# Patient Record
Sex: Male | Born: 1985 | Hispanic: Yes | Marital: Single | Smoking: Current every day smoker
Health system: Southern US, Community
[De-identification: ages and names within clinical notes are randomized; demographics above are authoritative.]

## PROBLEM LIST (undated history)

## (undated) DIAGNOSIS — N200 Calculus of kidney: Secondary | ICD-10-CM

## (undated) HISTORY — PX: CHOLECYSTECTOMY: SHX55

---

## 2012-04-01 ENCOUNTER — Emergency Department (HOSPITAL_BASED_OUTPATIENT_CLINIC_OR_DEPARTMENT_OTHER)
Admission: EM | Admit: 2012-04-01 | Discharge: 2012-04-01 | Disposition: A | Discharge status: Home / Self Care | Payer: Self-pay | Attending: Emergency Medicine | Admitting: Emergency Medicine

## 2012-04-01 ENCOUNTER — Encounter (HOSPITAL_BASED_OUTPATIENT_CLINIC_OR_DEPARTMENT_OTHER): Payer: Self-pay | Admitting: Family Medicine

## 2012-04-01 DIAGNOSIS — L02213 Cutaneous abscess of chest wall: Secondary | ICD-10-CM

## 2012-04-01 DIAGNOSIS — F17200 Nicotine dependence, unspecified, uncomplicated: Secondary | ICD-10-CM | POA: Insufficient documentation

## 2012-04-01 DIAGNOSIS — L02219 Cutaneous abscess of trunk, unspecified: Secondary | ICD-10-CM | POA: Insufficient documentation

## 2012-04-01 DIAGNOSIS — Z9089 Acquired absence of other organs: Secondary | ICD-10-CM | POA: Insufficient documentation

## 2012-04-01 MED ORDER — HYDROCODONE-ACETAMINOPHEN 5-500 MG PO TABS: 1.0000 | ORAL_TABLET | Freq: Four times a day (QID) | ORAL | Status: DC | PRN

## 2012-04-01 MED ORDER — SULFAMETHOXAZOLE-TRIMETHOPRIM 800-160 MG PO TABS: 1.0000 | ORAL_TABLET | Freq: Two times a day (BID) | ORAL | Status: DC

## 2012-04-01 NOTE — ED Notes (Signed)
Pt c/o abscess to left side x 5 months. Pt denies fever, n/v. Pt sts it has "gotten bigger and more painful". Denies drainage.

## 2012-04-01 NOTE — ED Provider Notes (Signed)
History  This chart was scribed for Nicholas Lyons, MD by Erskine Emery. This patient was seen in room MH09/MH09 and the patient's care was started at 17:30.   CSN: [id number]  Arrival date & time 04/01/12  1610   First MD Initiated Contact with Patient 04/01/12 1730      Chief Complaint  Patient presents with  . Abscess    (Consider location/radiation/quality/duration/timing/severity/associated sxs/prior treatment) The history is provided by the patient. No language interpreter was used.  Nicholas Bradshaw is a 26 y.o. male who presents to the Emergency Department complaining of a gradually worsening abscess on his left side for the past 5 months. Pt denies any associated drainage, nausea, emesis, or fever. Pt denies seeing anyone yet for the abscess.   History reviewed. No pertinent past medical history.  Past Surgical History  Procedure Date  . Cholecystectomy     No family history on file.  History  Substance Use Topics  . Smoking status: Current Every Day Smoker  . Smokeless tobacco: Not on file  . Alcohol Use: Yes      Review of Systems A complete 10 system review of systems was obtained and all systems are negative except as noted in the HPI and PMH.    Allergies  Review of patient's allergies indicates no known allergies.  Home Medications  No current outpatient prescriptions on file.  Triage Vitals: BP 143/97  Pulse 79  Temp 97.9 F (36.6 C) (Oral)  Resp 18  Ht 5\' 11"  (1.803 m)  Wt 250 lb (113.399 kg)  BMI 34.87 kg/m2  SpO2 100%  Physical Exam  Nursing note and vitals reviewed. Constitutional: He is oriented to person, place, and time. He appears well-developed and well-nourished. No distress.  HENT:  Head: Normocephalic and atraumatic.  Eyes: EOM are normal. Pupils are equal, round, and reactive to light.  Neck: Neck supple. No tracheal deviation present.  Cardiovascular: Normal rate.   Pulmonary/Chest: Effort normal. No respiratory distress.    Abdominal: Soft. He exhibits no distension.  Musculoskeletal: Normal range of motion. He exhibits no edema.  Neurological: He is alert and oriented to person, place, and time.  Skin: Skin is warm and dry.       Left lateral chest wall: a 3 cm firm erythematous fluctuant area.  Psychiatric: He has a normal mood and affect.    ED Course  Procedures (including critical care time) DIAGNOSTIC STUDIES: Oxygen Saturation is 100% on room air, normal by my interpretation.    COORDINATION OF CARE: 17:40--I evaluated the patient and we discussed a treatment plan including incision and drainage, antibiotics, and pain medication to which the pt agreed. I instructed the pt to soak the area and come back for follow up.  17:42--INCISION AND DRAINAGE PROCEDURE NOTE: Patient identification was confirmed and verbal consent was obtained. This procedure was performed by Nicholas Lyons, MD at 5:41 PM. Site: Left lateral chest wall Sterile procedures observed: yes Needle size: 27 gage Anesthetic used (type and amt): 2% lidocaine with epinephrine Blade size: #11 Drainage: combination of purulent and sebaceous material Complexity: Complex Packing used: 1/4 inch iodoform Site anesthetized, incision made over site, wound drained and explored loculations, rinsed with copious amounts of normal saline, wound packed with sterile gauze, covered with dry, sterile dressing.  Pt tolerated procedure well without complications.  Instructions for care discussed verbally and pt provided with additional written instructions for homecare and f/u.    Labs Reviewed - No data to display No results found.  No diagnosis found.    MDM  Will treat with bactrim, recheck in 2 days.        I personally performed the services described in this documentation, which was scribed in my presence. The recorded information has been reviewed and considered.      Nicholas Lyons, MD 04/03/12 [phone]

## 2012-06-23 ENCOUNTER — Encounter (HOSPITAL_BASED_OUTPATIENT_CLINIC_OR_DEPARTMENT_OTHER): Payer: Self-pay | Admitting: *Deleted

## 2012-06-23 ENCOUNTER — Emergency Department (HOSPITAL_BASED_OUTPATIENT_CLINIC_OR_DEPARTMENT_OTHER)
Admission: EM | Admit: 2012-06-23 | Discharge: 2012-06-23 | Disposition: A | Discharge status: Home / Self Care | Payer: Self-pay | Attending: Emergency Medicine | Admitting: Emergency Medicine

## 2012-06-23 ENCOUNTER — Emergency Department (HOSPITAL_BASED_OUTPATIENT_CLINIC_OR_DEPARTMENT_OTHER): Payer: Self-pay

## 2012-06-23 DIAGNOSIS — Y9289 Other specified places as the place of occurrence of the external cause: Secondary | ICD-10-CM | POA: Insufficient documentation

## 2012-06-23 DIAGNOSIS — S335XXA Sprain of ligaments of lumbar spine, initial encounter: Secondary | ICD-10-CM | POA: Insufficient documentation

## 2012-06-23 DIAGNOSIS — S39012A Strain of muscle, fascia and tendon of lower back, initial encounter: Secondary | ICD-10-CM

## 2012-06-23 DIAGNOSIS — Y9389 Activity, other specified: Secondary | ICD-10-CM | POA: Insufficient documentation

## 2012-06-23 DIAGNOSIS — Y990 Civilian activity done for income or pay: Secondary | ICD-10-CM | POA: Insufficient documentation

## 2012-06-23 DIAGNOSIS — X503XXA Overexertion from repetitive movements, initial encounter: Secondary | ICD-10-CM | POA: Insufficient documentation

## 2012-06-23 DIAGNOSIS — F17200 Nicotine dependence, unspecified, uncomplicated: Secondary | ICD-10-CM | POA: Insufficient documentation

## 2012-06-23 LAB — URINALYSIS, ROUTINE W REFLEX MICROSCOPIC
Glucose, UA: NEGATIVE mg/dL
Hgb urine dipstick: NEGATIVE
Specific Gravity, Urine: 1.03 (ref 1.005–1.030)
pH: 5.5 (ref 5.0–8.0)

## 2012-06-23 MED ORDER — HYDROCODONE-ACETAMINOPHEN 5-325 MG PO TABS
1.0000 | ORAL_TABLET | Freq: Once | ORAL | Status: AC
  Administered 2012-06-23: 1 via ORAL
  Filled 2012-06-23: qty 1

## 2012-06-23 MED ORDER — CYCLOBENZAPRINE HCL 10 MG PO TABS: 10.0000 mg | ORAL_TABLET | Freq: Two times a day (BID) | ORAL | Status: DC | PRN

## 2012-06-23 MED ORDER — NAPROXEN 500 MG PO TABS: 500.0000 mg | ORAL_TABLET | Freq: Two times a day (BID) | ORAL | Status: DC

## 2012-06-23 MED ORDER — HYDROCODONE-ACETAMINOPHEN 5-325 MG PO TABS: 1.0000 | ORAL_TABLET | Freq: Four times a day (QID) | ORAL | Status: DC | PRN

## 2012-06-23 NOTE — ED Notes (Signed)
Back pain x 3 days 

## 2012-06-23 NOTE — ED Provider Notes (Signed)
History  This chart was scribed for Nicholas Jakes, MD by Shari Heritage, ED Scribe. The patient was seen in room MH12/MH12. Patient's care was started at 1644.   CSN: [id number]  Arrival date & time 06/23/12  1515   First MD Initiated Contact with Patient 06/23/12 1644      Chief Complaint  Patient presents with  . Back Pain     Patient is a 27 y.o. male presenting with back pain. The history is provided by the patient. No language interpreter was used.  Back Pain  This is a new problem. The current episode started more than 2 days ago. The problem occurs daily. The problem has not changed since onset.The pain is associated with lifting heavy objects. The pain is present in the lumbar spine. The pain does not radiate. The pain is moderate. The symptoms are aggravated by bending, certain positions and twisting. Pertinent negatives include no chest pain, no fever, no numbness, no headaches, no abdominal pain, no dysuria, no leg pain and no weakness. He has tried NSAIDs and analgesics for the symptoms. The treatment provided no relief.    HPI Comments: Nicholas Bradshaw is a 27 y.o. male who presents to the Emergency Department complaining of intermittet, dull, moderate to severe, bilateral lower back pain onset 3 days ago. Patient says that he had some soreness in the back prior to 3 days ago when he lifted something heavy at work. The pain does not radiate into the legs. Pain is aggravated with movement and while sitting up. Patient states that when the pain come on, it is sometimes so severe that he almost "passes out." Patient has been taking motrin for the pain. He has also taken 1 vicodin with no significant relief. Patient is a current every day smoker. Patient denies numbness and weakness in his feet. Patient denies headache, fever, chills, cough, congestion, sore throat, chest pain, shortness of breath, abdominal pain, nausea, vomiting, diarrhea, dysuria, hematuria, rash, bleeding easily,  or visual changes. Patient reports no significant past medical history. Patient has a surgical history of cholecystectomy. He is a current every day smoker.   History reviewed. No pertinent past medical history.  Past Surgical History  Procedure Date  . Cholecystectomy     History reviewed. No pertinent family history.  History  Substance Use Topics  . Smoking status: Current Every Day Smoker  . Smokeless tobacco: Not on file  . Alcohol Use: Yes      Review of Systems  Constitutional: Negative for fever.  HENT: Negative for congestion and sore throat.   Eyes: Negative for visual disturbance.  Respiratory: Negative for cough.   Cardiovascular: Negative for chest pain.  Gastrointestinal: Negative for nausea, vomiting, abdominal pain and diarrhea.  Genitourinary: Negative for dysuria.  Musculoskeletal: Positive for back pain.  Skin: Negative for rash.  Neurological: Negative for weakness, numbness and headaches.  Hematological: Does not bruise/bleed easily.    Allergies  Review of patient's allergies indicates no known allergies.  Home Medications   Current Outpatient Rx  Name  Route  Sig  Dispense  Refill  . CYCLOBENZAPRINE HCL 10 MG PO TABS   Oral   Take 1 tablet (10 mg total) by mouth 2 (two) times daily as needed for muscle spasms.   20 tablet   0   . HYDROCODONE-ACETAMINOPHEN 5-325 MG PO TABS   Oral   Take 1-2 tablets by mouth every 6 (six) hours as needed for pain.   14 tablet   0   .  HYDROCODONE-ACETAMINOPHEN 5-500 MG PO TABS   Oral   Take 1-2 tablets by mouth every 6 (six) hours as needed for pain.   15 tablet   0   . NAPROXEN 500 MG PO TABS   Oral   Take 1 tablet (500 mg total) by mouth 2 (two) times daily.   14 tablet   0   . SULFAMETHOXAZOLE-TRIMETHOPRIM 800-160 MG PO TABS   Oral   Take 1 tablet by mouth 2 (two) times daily.   14 tablet   0     Triage Vitals: BP 151/68  Pulse 82  Temp 99.2 F (37.3 C) (Oral)  Resp 20  Ht 5\' 11"   (1.803 m)  Wt 250 lb (113.399 kg)  BMI 34.87 kg/m2  SpO2 100%  Physical Exam  Constitutional: He is oriented to person, place, and time. He appears well-developed and well-nourished. No distress.  HENT:  Head: Normocephalic and atraumatic.  Mouth/Throat: Oropharynx is clear and moist and mucous membranes are normal. Mucous membranes are not dry. No oropharyngeal exudate or posterior oropharyngeal erythema.  Eyes: Conjunctivae normal and EOM are normal. Pupils are equal, round, and reactive to light.  Neck: Neck supple.  Cardiovascular: Normal rate and regular rhythm.   No murmur heard. Pulmonary/Chest: Effort normal and breath sounds normal. No respiratory distress. He has no wheezes. He has no rales.  Abdominal: Soft. Bowel sounds are normal. He exhibits no distension. There is no tenderness. There is no rebound and no guarding.  Musculoskeletal:       Lumbar back: He exhibits tenderness.       Tenderness to paraspinal lumbar area and midline sacral area. No muscle spasm.   Neurological: He is alert and oriented to person, place, and time. No cranial nerve deficit.  Skin: Skin is warm and dry. No rash noted.  Psychiatric: He has a normal mood and affect. His behavior is normal.    ED Course  Procedures (including critical care time) DIAGNOSTIC STUDIES: Oxygen Saturation is 100% on room air, normal by my interpretation.    COORDINATION OF CARE: 4:55 PM- Patient informed of current plan for treatment and evaluation and agrees with plan at this time.   Results for orders placed during the hospital encounter of 06/23/12  URINALYSIS, ROUTINE W REFLEX MICROSCOPIC      Component Value Range   Color, Urine YELLOW  YELLOW   APPearance CLEAR  CLEAR   Specific Gravity, Urine 1.030  1.005 - 1.030   pH 5.5  5.0 - 8.0   Glucose, UA NEGATIVE  NEGATIVE mg/dL   Hgb urine dipstick NEGATIVE  NEGATIVE   Bilirubin Urine NEGATIVE  NEGATIVE   Ketones, ur NEGATIVE  NEGATIVE mg/dL   Protein, ur  NEGATIVE  NEGATIVE mg/dL   Urobilinogen, UA 0.2  0.0 - 1.0 mg/dL   Nitrite NEGATIVE  NEGATIVE   Leukocytes, UA NEGATIVE  NEGATIVE    Dg Lumbar Spine Complete  06/23/2012  *RADIOLOGY REPORT*  Clinical Data: Low back pain after injury 3 days ago.  LUMBAR SPINE - COMPLETE 4+ VIEW  Comparison: None.  Findings: Five non-rib bearing lumbar vertebra with anatomic alignment.  No visible fractures.  Well-preserved disc spaces.  No pars defects.  No significant facet arthropathy.  No evidence of spondylosis.  Visualized sacroiliac joints intact.  IMPRESSION: Normal examination.   Original Report Authenticated By: Hulan Saas, M.D.      1. Lumbar strain       MDM  X-rays of the lumbar back area without  any bony abnormalities. Symptoms consistent with a lumbar strain related to work. No focal neuro deficits. Will treat with rest pain medicine muscle relaxers and anti-inflammatory medicine and work note.      I personally performed the services described in this documentation, which was scribed in my presence. The recorded information has been reviewed and is accurate.     Nicholas Jakes, MD 06/23/12 [phone]

## 2013-08-08 ENCOUNTER — Emergency Department (HOSPITAL_BASED_OUTPATIENT_CLINIC_OR_DEPARTMENT_OTHER): Payer: Self-pay

## 2013-08-08 ENCOUNTER — Encounter (HOSPITAL_BASED_OUTPATIENT_CLINIC_OR_DEPARTMENT_OTHER): Payer: Self-pay | Admitting: Emergency Medicine

## 2013-08-08 ENCOUNTER — Emergency Department (HOSPITAL_BASED_OUTPATIENT_CLINIC_OR_DEPARTMENT_OTHER)
Admission: EM | Admit: 2013-08-08 | Discharge: 2013-08-09 | Disposition: A | Discharge status: Home / Self Care | Payer: Self-pay | Attending: Emergency Medicine | Admitting: Emergency Medicine

## 2013-08-08 DIAGNOSIS — S9001XA Contusion of right ankle, initial encounter: Secondary | ICD-10-CM

## 2013-08-08 DIAGNOSIS — S9000XA Contusion of unspecified ankle, initial encounter: Secondary | ICD-10-CM | POA: Insufficient documentation

## 2013-08-08 DIAGNOSIS — F17200 Nicotine dependence, unspecified, uncomplicated: Secondary | ICD-10-CM | POA: Insufficient documentation

## 2013-08-08 DIAGNOSIS — Y939 Activity, unspecified: Secondary | ICD-10-CM | POA: Insufficient documentation

## 2013-08-08 DIAGNOSIS — Z791 Long term (current) use of non-steroidal anti-inflammatories (NSAID): Secondary | ICD-10-CM | POA: Insufficient documentation

## 2013-08-08 DIAGNOSIS — W208XXA Other cause of strike by thrown, projected or falling object, initial encounter: Secondary | ICD-10-CM | POA: Insufficient documentation

## 2013-08-08 DIAGNOSIS — IMO0002 Reserved for concepts with insufficient information to code with codable children: Secondary | ICD-10-CM | POA: Insufficient documentation

## 2013-08-08 DIAGNOSIS — Y929 Unspecified place or not applicable: Secondary | ICD-10-CM | POA: Insufficient documentation

## 2013-08-08 MED ORDER — HYDROCODONE-ACETAMINOPHEN 5-325 MG PO TABS: 1.0000 | ORAL_TABLET | ORAL | Status: DC | PRN

## 2013-08-08 MED ORDER — IBUPROFEN 600 MG PO TABS: 600.0000 mg | ORAL_TABLET | Freq: Three times a day (TID) | ORAL | Status: DC | PRN

## 2013-08-08 MED ORDER — IBUPROFEN 400 MG PO TABS
600.0000 mg | ORAL_TABLET | Freq: Once | ORAL | Status: AC
  Administered 2013-08-08: 600 mg via ORAL
  Filled 2013-08-08 (×2): qty 1

## 2013-08-08 MED ORDER — HYDROCODONE-ACETAMINOPHEN 5-325 MG PO TABS
1.0000 | ORAL_TABLET | Freq: Once | ORAL | Status: AC
  Administered 2013-08-08: 1 via ORAL
  Filled 2013-08-08 (×2): qty 1

## 2013-08-08 NOTE — ED Notes (Signed)
Pt reports he dropped a stump on his ankle approx 3 hours ago- ambulatory in ED waiting area

## 2013-08-08 NOTE — ED Provider Notes (Signed)
CSN: [id number]     Arrival date & time 08/08/13  2235 History  This chart was scribed for Lyanne CoKevin M Naevia Unterreiner, MD by Valera CastleSteven Perry, ED Scribe. This patient was seen in room MH12/MH12 and the patient's care was started at 11:44 PM.   Chief Complaint  Patient presents with  . Ankle Pain   (Consider location/radiation/quality/duration/timing/severity/associated sxs/prior Treatment) The history is provided by the patient. No language interpreter was used.   HPI Comments: Nicholas Bradshaw is a 28 y.o. male who presents to the Emergency Department complaining of constant, moderate,  right ankle pain, onset 3-4 hours PTA, after he dropped a stump on the top of his ankle. He is ambulatory into ED. He reports taking 1/2 Percocet for the pain, without relief. He was prescribed Percocet for dysuria, urinary issues. He denies any other associated symptoms.  PCP - No PCP Per Patient  History reviewed. No pertinent past medical history. Past Surgical History  Procedure Laterality Date  . Cholecystectomy     No family history on file. History  Substance Use Topics  . Smoking status: Current Every Day Smoker  . Smokeless tobacco: Never Used  . Alcohol Use: 4.2 oz/week    7 Cans of beer per week    Review of Systems A complete 10 system review of systems was obtained and all systems are negative except as noted in the HPI and PMH.   Allergies  Review of patient's allergies indicates no known allergies.  Home Medications   Current Outpatient Rx  Name  Route  Sig  Dispense  Refill  . cyclobenzaprine (FLEXERIL) 10 MG tablet   Oral   Take 1 tablet (10 mg total) by mouth 2 (two) times daily as needed for muscle spasms.   20 tablet   0   . HYDROcodone-acetaminophen (NORCO/VICODIN) 5-325 MG per tablet   Oral   Take 1-2 tablets by mouth every 6 (six) hours as needed for pain.   14 tablet   0   . HYDROcodone-acetaminophen (VICODIN) 5-500 MG per tablet   Oral   Take 1-2 tablets by mouth every 6  (six) hours as needed for pain.   15 tablet   0   . naproxen (NAPROSYN) 500 MG tablet   Oral   Take 1 tablet (500 mg total) by mouth 2 (two) times daily.   14 tablet   0   . sulfamethoxazole-trimethoprim (SEPTRA DS) 800-160 MG per tablet   Oral   Take 1 tablet by mouth 2 (two) times daily.   14 tablet   0    BP 129/76  Pulse 87  Temp(Src) 98.4 F (36.9 C) (Oral)  Resp 16  Ht 5\' 10"  (1.778 m)  Wt 268 lb (121.564 kg)  BMI 38.45 kg/m2  SpO2 97%  Physical Exam  Nursing note and vitals reviewed. Constitutional: He is oriented to person, place, and time. He appears well-developed and well-nourished. No distress.  HENT:  Head: Normocephalic and atraumatic.  Eyes: EOM are normal.  Neck: Neck supple.  Cardiovascular: Normal rate.   Pulmonary/Chest: Effort normal. No respiratory distress.  Musculoskeletal: Normal range of motion. He exhibits edema and tenderness.  Mild swelling over right lateral malleolus without significant tenderness. Mild tenderness of right medial malleolus without significant tenderness. Normal DP and PT pulses in right foot. No laceration. No bruising.   Neurological: He is alert and oriented to person, place, and time.  Skin: Skin is warm and dry.  Psychiatric: He has a normal mood and affect. His  behavior is normal.    ED Course  Procedures (including critical care time)  DIAGNOSTIC STUDIES: Oxygen Saturation is 97% on room air, normal by my interpretation.    COORDINATION OF CARE: 11:48 PM - Discussed treatment plan which includes Ibuprofen and Vicodin with pt at bedside and pt agreed to plan. Advised pt to follow up if pain persists, worsens over 1 week.   Dg Ankle Complete Right  08/08/2013   CLINICAL DATA:  Dropped stump on right ankle. Right ankle pain and swelling.  EXAM: RIGHT ANKLE - COMPLETE 3+ VIEW  COMPARISON:  None.  FINDINGS: There is no evidence of fracture or dislocation. The ankle mortise is intact; the interosseous space is within  normal limits. No talar tilt or subluxation is seen. 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  The joint spaces are preserved. No significant soft tissue abnormalities are seen.  IMPRESSION: No evidence of fracture or dislocation.   Electronically Signed   By: Roanna Raider M.D.   On: 08/08/2013 23:35  I personally reviewed the imaging tests through PACS system I reviewed available ER/hospitalization records through the EMR   EKG Interpretation   None      Medications  ibuprofen (ADVIL,MOTRIN) tablet 600 mg (not administered)  HYDROcodone-acetaminophen (NORCO/VICODIN) 5-325 MG per tablet 1 tablet (not administered)    MDM   Final diagnoses:  Contusion of right ankle    He is no tenderness of his right foot.  All of his tenderness is located around the ankle mortise.  Plain films negative.  Discharge home in good condition.  Compartment soft.  Neurovascularly intact  I personally performed the services described in this documentation, which was scribed in my presence. The recorded information has been reviewed and is accurate.      Lyanne Co, MD 08/09/13 [phone]

## 2015-08-08 ENCOUNTER — Emergency Department (HOSPITAL_BASED_OUTPATIENT_CLINIC_OR_DEPARTMENT_OTHER)
Admission: EM | Admit: 2015-08-08 | Discharge: 2015-08-08 | Disposition: A | Discharge status: Home / Self Care | Payer: Self-pay | Attending: Emergency Medicine | Admitting: Emergency Medicine

## 2015-08-08 ENCOUNTER — Encounter (HOSPITAL_BASED_OUTPATIENT_CLINIC_OR_DEPARTMENT_OTHER): Payer: Self-pay

## 2015-08-08 ENCOUNTER — Emergency Department (HOSPITAL_BASED_OUTPATIENT_CLINIC_OR_DEPARTMENT_OTHER): Payer: Self-pay

## 2015-08-08 DIAGNOSIS — Z87442 Personal history of urinary calculi: Secondary | ICD-10-CM | POA: Insufficient documentation

## 2015-08-08 DIAGNOSIS — N3001 Acute cystitis with hematuria: Secondary | ICD-10-CM | POA: Insufficient documentation

## 2015-08-08 DIAGNOSIS — F17200 Nicotine dependence, unspecified, uncomplicated: Secondary | ICD-10-CM | POA: Insufficient documentation

## 2015-08-08 HISTORY — DX: Calculus of kidney: N20.0

## 2015-08-08 LAB — BASIC METABOLIC PANEL
ANION GAP: 6 (ref 5–15)
BUN: 12 mg/dL (ref 6–20)
CALCIUM: 9 mg/dL (ref 8.9–10.3)
CHLORIDE: 107 mmol/L (ref 101–111)
CO2: 26 mmol/L (ref 22–32)
CREATININE: 0.83 mg/dL (ref 0.61–1.24)
GFR calc non Af Amer: >60 mL/min (ref >60)
Glucose, Bld: 98 mg/dL (ref 65–99)
Potassium: 3.7 mmol/L (ref 3.5–5.1)
SODIUM: 139 mmol/L (ref 135–145)

## 2015-08-08 LAB — URINALYSIS, ROUTINE W REFLEX MICROSCOPIC
BILIRUBIN URINE: NEGATIVE
Glucose, UA: NEGATIVE mg/dL
KETONES UR: NEGATIVE mg/dL
NITRITE: NEGATIVE
Protein, ur: 100 mg/dL — AB
SPECIFIC GRAVITY, URINE: 1.023 (ref 1.005–1.030)
pH: 6.5 (ref 5.0–8.0)

## 2015-08-08 LAB — CBC WITH DIFFERENTIAL/PLATELET
BASOS ABS: 0.1 10*3/uL (ref 0.0–0.1)
BASOS PCT: 1 %
EOS ABS: 0.4 10*3/uL (ref 0.0–0.7)
Eosinophils Relative: 3 %
HCT: 45.9 % (ref 39.0–52.0)
HEMOGLOBIN: 14.9 g/dL (ref 13.0–17.0)
Lymphocytes Relative: 18 %
Lymphs Abs: 1.9 10*3/uL (ref 0.7–4.0)
MCH: 25.8 pg — ABNORMAL LOW (ref 26.0–34.0)
MCHC: 32.5 g/dL (ref 30.0–36.0)
MCV: 79.5 fL (ref 78.0–100.0)
MONOS PCT: 6 %
Monocytes Absolute: 0.6 10*3/uL (ref 0.1–1.0)
NEUTROS PCT: 72 %
Neutro Abs: 7.6 10*3/uL (ref 1.7–7.7)
Platelets: 249 10*3/uL (ref 150–400)
RBC: 5.77 MIL/uL (ref 4.22–5.81)
RDW: 14.2 % (ref 11.5–15.5)
WBC: 10.5 10*3/uL (ref 4.0–10.5)

## 2015-08-08 LAB — URINE MICROSCOPIC-ADD ON

## 2015-08-08 MED ORDER — FENTANYL CITRATE (PF) 100 MCG/2ML IJ SOLN
100.0000 ug | Freq: Once | INTRAMUSCULAR | Status: AC
  Administered 2015-08-08: 100 ug via INTRAVENOUS
  Filled 2015-08-08: qty 2

## 2015-08-08 MED ORDER — HYDROCODONE-ACETAMINOPHEN 5-325 MG PO TABS
1.0000 | ORAL_TABLET | Freq: Once | ORAL | Status: AC
  Administered 2015-08-08: 1 via ORAL
  Filled 2015-08-08: qty 1

## 2015-08-08 MED ORDER — FENTANYL CITRATE (PF) 100 MCG/2ML IJ SOLN
50.0000 ug | Freq: Once | INTRAMUSCULAR | Status: AC
  Administered 2015-08-08: 50 ug via INTRAVENOUS
  Filled 2015-08-08: qty 2

## 2015-08-08 MED ORDER — LIDOCAINE HCL (PF) 1 % IJ SOLN
INTRAMUSCULAR | Status: AC
  Administered 2015-08-08: 5 mL
  Filled 2015-08-08: qty 5

## 2015-08-08 MED ORDER — CIPROFLOXACIN HCL 500 MG PO TABS: 500.0000 mg | ORAL_TABLET | Freq: Two times a day (BID) | ORAL | Status: AC

## 2015-08-08 MED ORDER — CEFTRIAXONE SODIUM 250 MG IJ SOLR
250.0000 mg | Freq: Once | INTRAMUSCULAR | Status: AC
  Administered 2015-08-08: 250 mg via INTRAMUSCULAR
  Filled 2015-08-08: qty 250

## 2015-08-08 MED ORDER — HYDROCODONE-ACETAMINOPHEN 5-325 MG PO TABS: ORAL_TABLET | ORAL | Status: AC

## 2015-08-08 MED ORDER — AZITHROMYCIN 250 MG PO TABS
1000.0000 mg | ORAL_TABLET | Freq: Once | ORAL | Status: AC
  Administered 2015-08-08: 1000 mg via ORAL
  Filled 2015-08-08: qty 4

## 2015-08-08 NOTE — ED Notes (Signed)
Pt made aware of the need for a urine sample. Pt unable to void at this time due to pain.

## 2015-08-08 NOTE — Discharge Instructions (Signed)
Please read and follow all provided instructions.  Your diagnoses today include:  1. Acute cystitis with hematuria    Tests performed today include:  Urine test - suggests that you have an infection in your bladder  CT scan - shows infection in your bladder, no kidney stones  Blood counts and electrolytes - normal  Vital signs. See below for your results today.   Medications prescribed:   Ciprofloxacin - antibiotic  You have been prescribed an antibiotic medicine: take the entire course of medicine even if you are feeling better. Stopping early can cause the antibiotic not to work.   Vicodin (hydrocodone/acetaminophen) - narcotic pain medication  DO NOT drive or perform any activities that require you to be awake and alert because this medicine can make you drowsy. BE VERY CAREFUL not to take multiple medicines containing Tylenol (also called acetaminophen). Doing so can lead to an overdose which can damage your liver and cause liver failure and possibly death.  Home care instructions:  Follow any educational materials contained in this packet.  Follow-up instructions: Please follow-up with the urinary tract doctor listed for further evaluation of your symptoms.  Return instructions:   Please return to the Emergency Department if you experience worsening symptoms.   Return with fever, worsening pain, persistent vomiting, worsening pain in your back.   Please return if you have any other emergent concerns.  Additional Information:  Your vital signs today were: BP 113/79 mmHg   Pulse 68   Temp(Src) 98.9 F (37.2 C) (Oral)   Resp 16   SpO2 100% If your blood pressure (BP) was elevated above 135/85 this visit, please have this repeated by your doctor within one month. --------------

## 2015-08-08 NOTE — ED Notes (Signed)
Patient transported to CT 

## 2015-08-08 NOTE — ED Notes (Signed)
Patient here with bladder and severe penile pain x 3 days, reports nausea with same. Hx of kidney stones per patient. Reports hematuria with same

## 2015-08-08 NOTE — ED Notes (Signed)
Pt still unable to void

## 2015-08-08 NOTE — ED Provider Notes (Signed)
CSN: [id number]     Arrival date & time 08/08/15  1649 History   First MD Initiated Contact with Patient 08/08/15 1705     Chief Complaint  Patient presents with  . possible kidney stone      (Consider location/radiation/quality/duration/timing/severity/associated sxs/prior Treatment) HPI Comments: Patient with reported history of kidney stones presents with complaint of penile pain with dysuria, with associated testicular pain and bilateral lower back pain. Patient states it feels like there is a stone which is obstructing his urine flow. No treatments prior to arrival. Onset was acute. Pain has been waxing and waning over the past 4 days. No fevers. He has had nausea but no vomiting. No diarrhea. Patient thinks that his pain is similar to previous kidney stones. No other penile discharge or groin swelling. Sexually active with one partner and pt feels he is low risk for STI. Aggravating factors: urination. Alleviating factors: none.    The history is provided by the patient.    Past Medical History  Diagnosis Date  . Kidney stones    Past Surgical History  Procedure Laterality Date  . Cholecystectomy     No family history on file. Social History  Substance Use Topics  . Smoking status: Current Every Day Smoker  . Smokeless tobacco: Never Used  . Alcohol Use: 4.2 oz/week    7 Cans of beer per week    Review of Systems  Constitutional: Negative for fever.  HENT: Negative for rhinorrhea and sore throat.   Eyes: Negative for redness.  Respiratory: Negative for cough.   Cardiovascular: Negative for chest pain.  Gastrointestinal: Positive for nausea and abdominal pain (suprapubic). Negative for vomiting and diarrhea.  Genitourinary: Positive for dysuria, hematuria, flank pain, difficulty urinating and testicular pain. Negative for penile swelling.  Musculoskeletal: Negative for myalgias.  Skin: Negative for rash.  Neurological: Negative for headaches.    Allergies  Review  of patient's allergies indicates no known allergies.  Home Medications   Prior to Admission medications   Not on File   BP 148/97 mmHg  Pulse 70  Temp(Src) 98.9 F (37.2 C) (Oral)  Resp 26  SpO2 100%   Physical Exam  Constitutional: He appears well-developed and well-nourished.  HENT:  Head: Normocephalic and atraumatic.  Eyes: Conjunctivae are normal. Right eye exhibits no discharge. Left eye exhibits no discharge.  Neck: Normal range of motion. Neck supple.  Cardiovascular: Normal rate, regular rhythm and normal heart sounds.   Pulmonary/Chest: Effort normal and breath sounds normal. No respiratory distress. He has no wheezes. He has no rales.  Abdominal: Soft. There is tenderness (suprapubic). There is no rebound, no guarding and no CVA tenderness.  Genitourinary: Right testis shows tenderness. Right testis shows no mass and no swelling. Left testis shows no mass and no swelling. Uncircumcised. No phimosis or paraphimosis.  Neurological: He is alert.  Skin: Skin is warm and dry.  Psychiatric: He has a normal mood and affect.  Nursing note and vitals reviewed.   ED Course  Procedures (including critical care time) Labs Review Labs Reviewed  CBC WITH DIFFERENTIAL/PLATELET - Abnormal; Notable for the following:    MCH 25.8 (*)    All other components within normal limits  URINALYSIS, ROUTINE W REFLEX MICROSCOPIC (NOT AT [facility]) - Abnormal; Notable for the following:    APPearance TURBID (*)    Hgb urine dipstick LARGE (*)    Protein, ur 100 (*)    Leukocytes, UA LARGE (*)    All other components within  normal limits  URINE MICROSCOPIC-ADD ON - Abnormal; Notable for the following:    Squamous Epithelial / LPF 0-5 (*)    Bacteria, UA FEW (*)    All other components within normal limits  URINE CULTURE  BASIC METABOLIC PANEL  GC/CHLAMYDIA PROBE AMP ([place]) NOT AT [facility]    Imaging Review Ct Renal Stone Study  08/08/2015  CLINICAL DATA:  30 year old with bilateral  flank pain for 4 days and hematuria. History of renal calculi. Previous appendectomy. EXAM: CT ABDOMEN AND PELVIS WITHOUT CONTRAST TECHNIQUE: Multidetector CT imaging of the abdomen and pelvis was performed following the standard protocol without IV contrast. COMPARISON:  CT 09/15/2011. FINDINGS: Lower chest: Clear lung bases. No significant pleural or pericardial effusion. Hepatobiliary: The liver demonstrates decreased density consistent with steatosis. No focal abnormalities are identified on noncontrast imaging. No significant biliary dilatation status post cholecystectomy. Pancreas: Unremarkable. No pancreatic ductal dilatation or surrounding inflammatory changes. Spleen: Normal in size without focal abnormality. Adrenals/Urinary Tract: Both adrenal glands appear normal. There is no evidence of urinary tract calculus or hydronephrosis. Both kidneys appear unremarkable as imaged in the noncontrast state. There is possible mild bladder wall thickening, although this could be related to incomplete distention. Stomach/Bowel: No evidence of bowel wall thickening, distention or surrounding inflammatory change. The appendix is visualized and appears normal. Vascular/Lymphatic: There are no enlarged abdominal or pelvic lymph nodes. No significant vascular findings on noncontrast imaging. Reproductive: Unremarkable. Other: No evidence of abdominal wall mass or hernia. Musculoskeletal: No acute or significant osseous findings. Mild degenerative changes are noted at both hips. IMPRESSION: 1. No evidence of urinary tract calculus or hydronephrosis. 2. Possible bladder wall thickening versus incomplete distention. Correlation with urine analysis recommended to exclude cystitis. 3. Hepatic steatosis. Electronically Signed   By: Carey Bullocks M.D.   On: 08/08/2015 18:03   I have personally reviewed and evaluated these images and lab results as part of my medical decision-making.   EKG Interpretation None       5:38  PM Patient seen and examined. Work-up initiated. Medications ordered.   Vital signs reviewed and are as follows: BP 148/97 mmHg  Pulse 70  Temp(Src) 98.9 F (37.2 C) (Oral)  Resp 26  SpO2 100%  7:11 PM patient informed of CT and lab/urine findings. He has been given IM Rocephin and PO azithromycin. D/c home with cipro and vicodin.   Urology follow-up given.  The patient was urged to return to the Emergency Department immediately with worsening of current symptoms, worsening abdominal pain, persistent vomiting, blood noted in stools, fever, or any other concerns. The patient verbalized understanding.    MDM   Final diagnoses:  Acute cystitis with hematuria   Patients with symptoms and CT consistent with acute cystitis. No obstructing kidney stone seen. Patient covered for urethritis with Rocephin and azithromycin. Given bladder symptoms and CT findings, will also treat as complicated cystitis with Cipro 10 days. This will also cover prostatitis. Patient otherwise appears well, nontoxic. Do not feel that this represents pyelonephritis at this time given lack of significant CVA tenderness or fever. Patient is well-appearing and feel that he can be safely discharged home at this time. I would like him to follow-up with urology if possible -- states that he has had a urinary tract infection in the past. Urine cultures pending.   Renne Crigler, PA-C 08/08/15 1914  Jerelyn Scott, MD 08/08/15 1919

## 2015-08-09 LAB — GC/CHLAMYDIA PROBE AMP (~~LOC~~) NOT AT ARMC
Chlamydia: NEGATIVE
Neisseria Gonorrhea: NEGATIVE

## 2015-08-11 LAB — URINE CULTURE

## 2015-08-11 IMAGING — CR DG ANKLE COMPLETE 3+V*R*
3 series · 3 of 3 positions shown · non-contrast
Comparison: None.

CLINICAL DATA: Dropped stump on right ankle. Right ankle pain and
swelling.

EXAM:
RIGHT ANKLE - COMPLETE 3+ VIEW

[t ankle joint ap right]
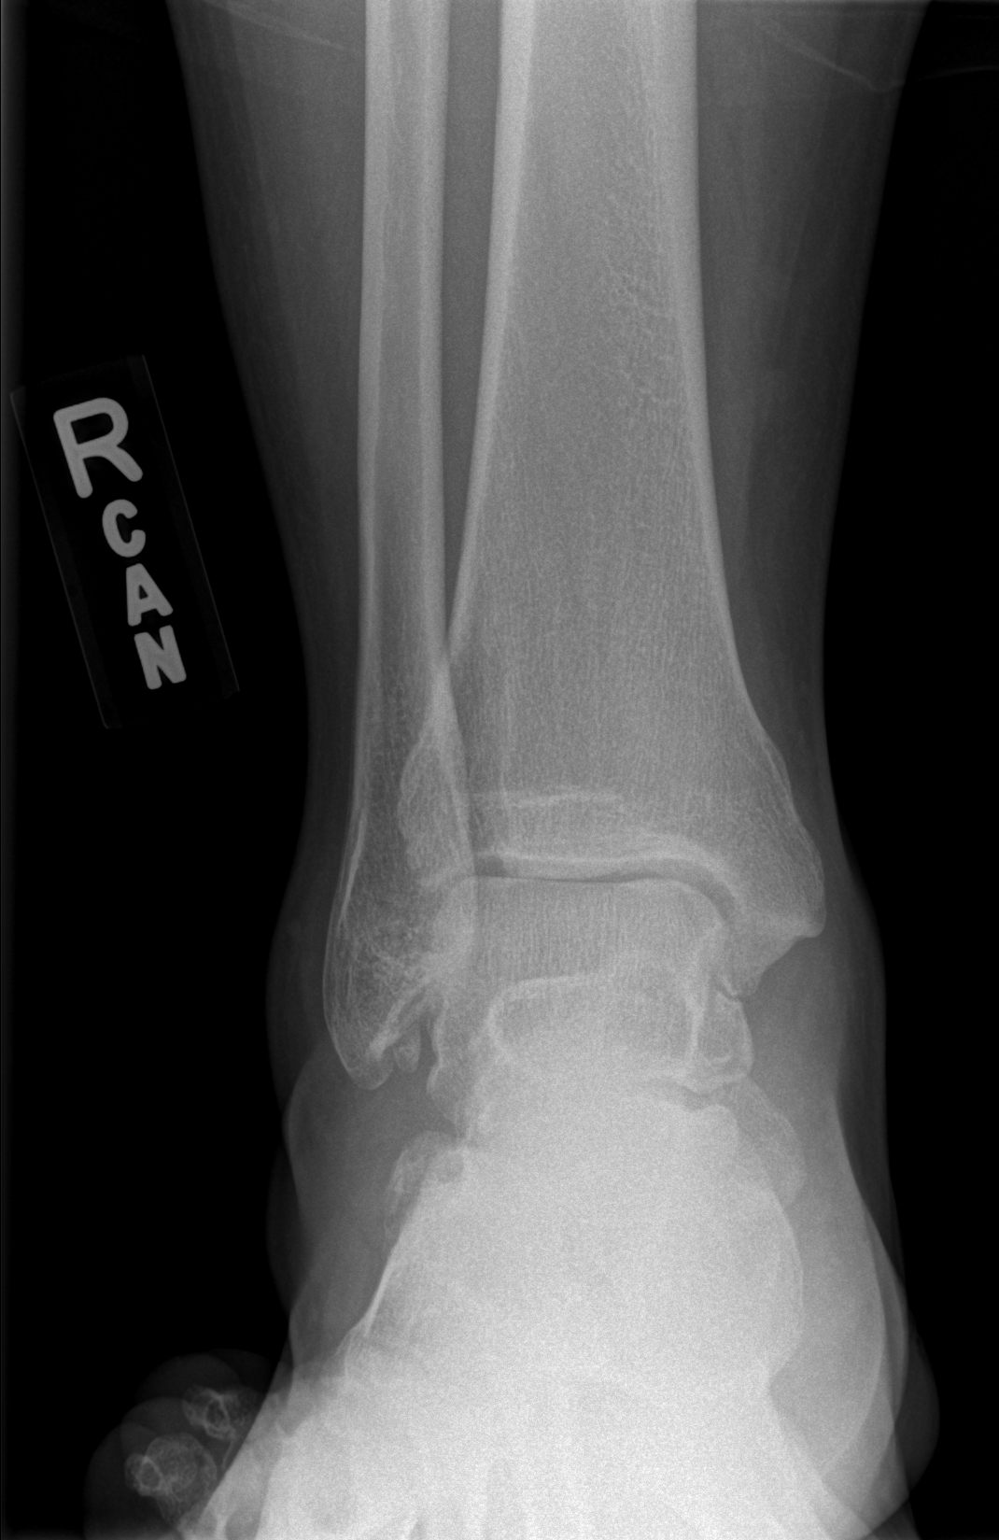

[t ankle joint oblique right]
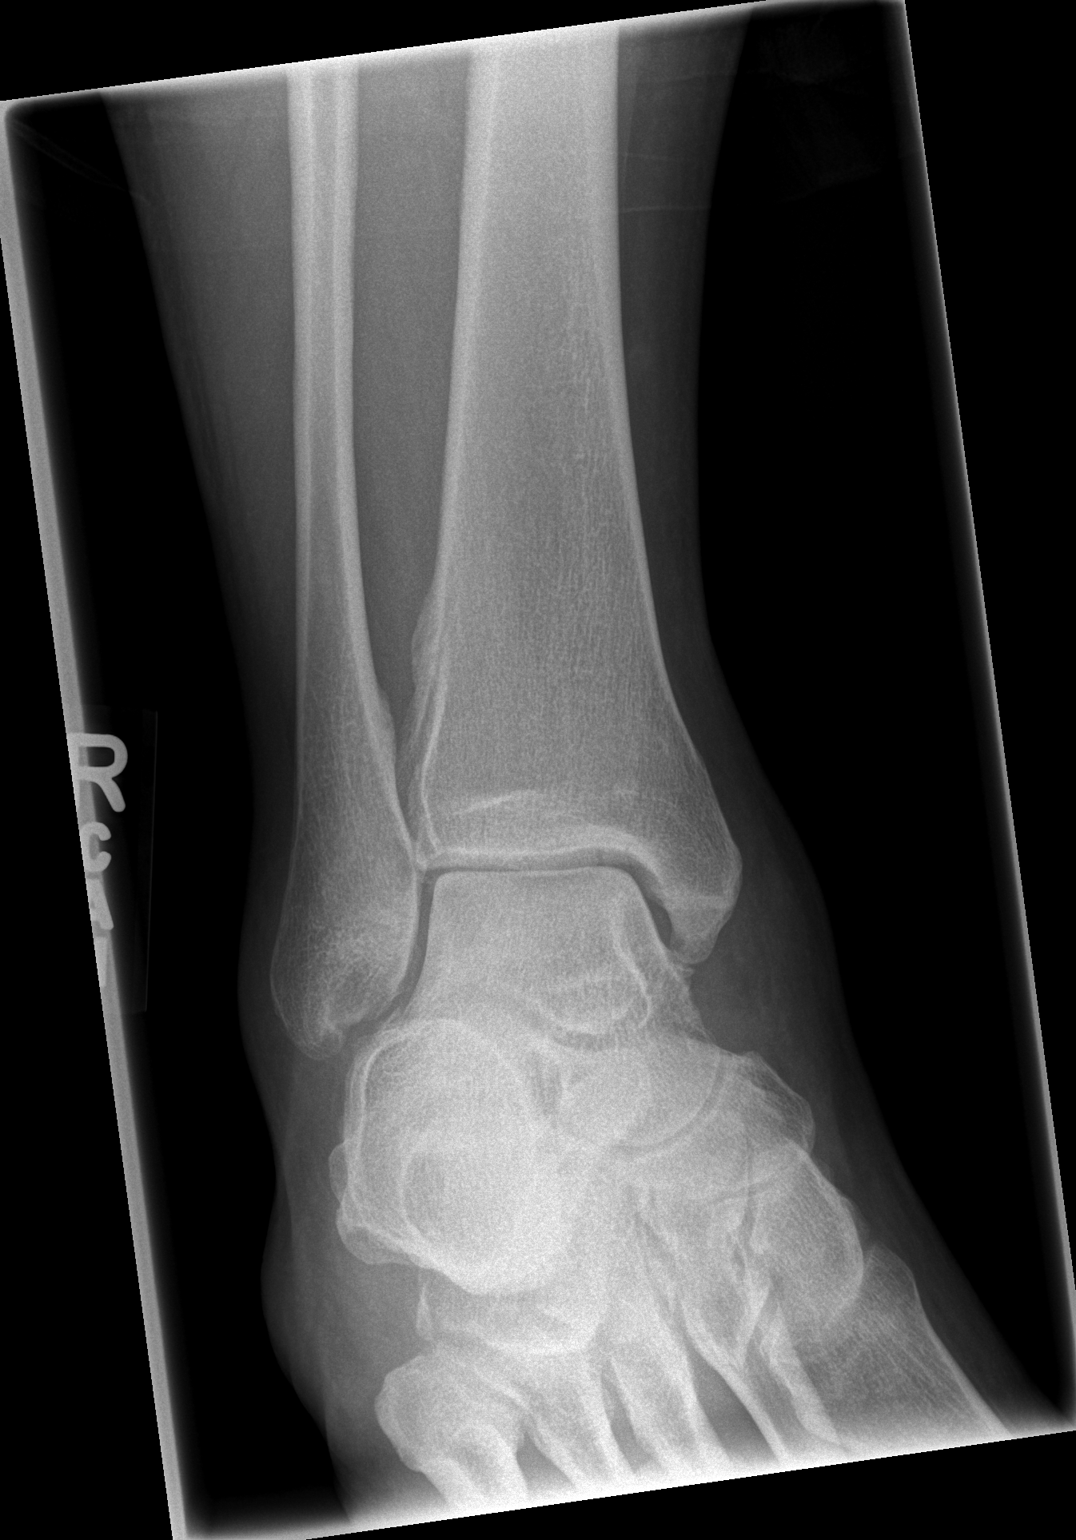

[t ankle joint lat right]
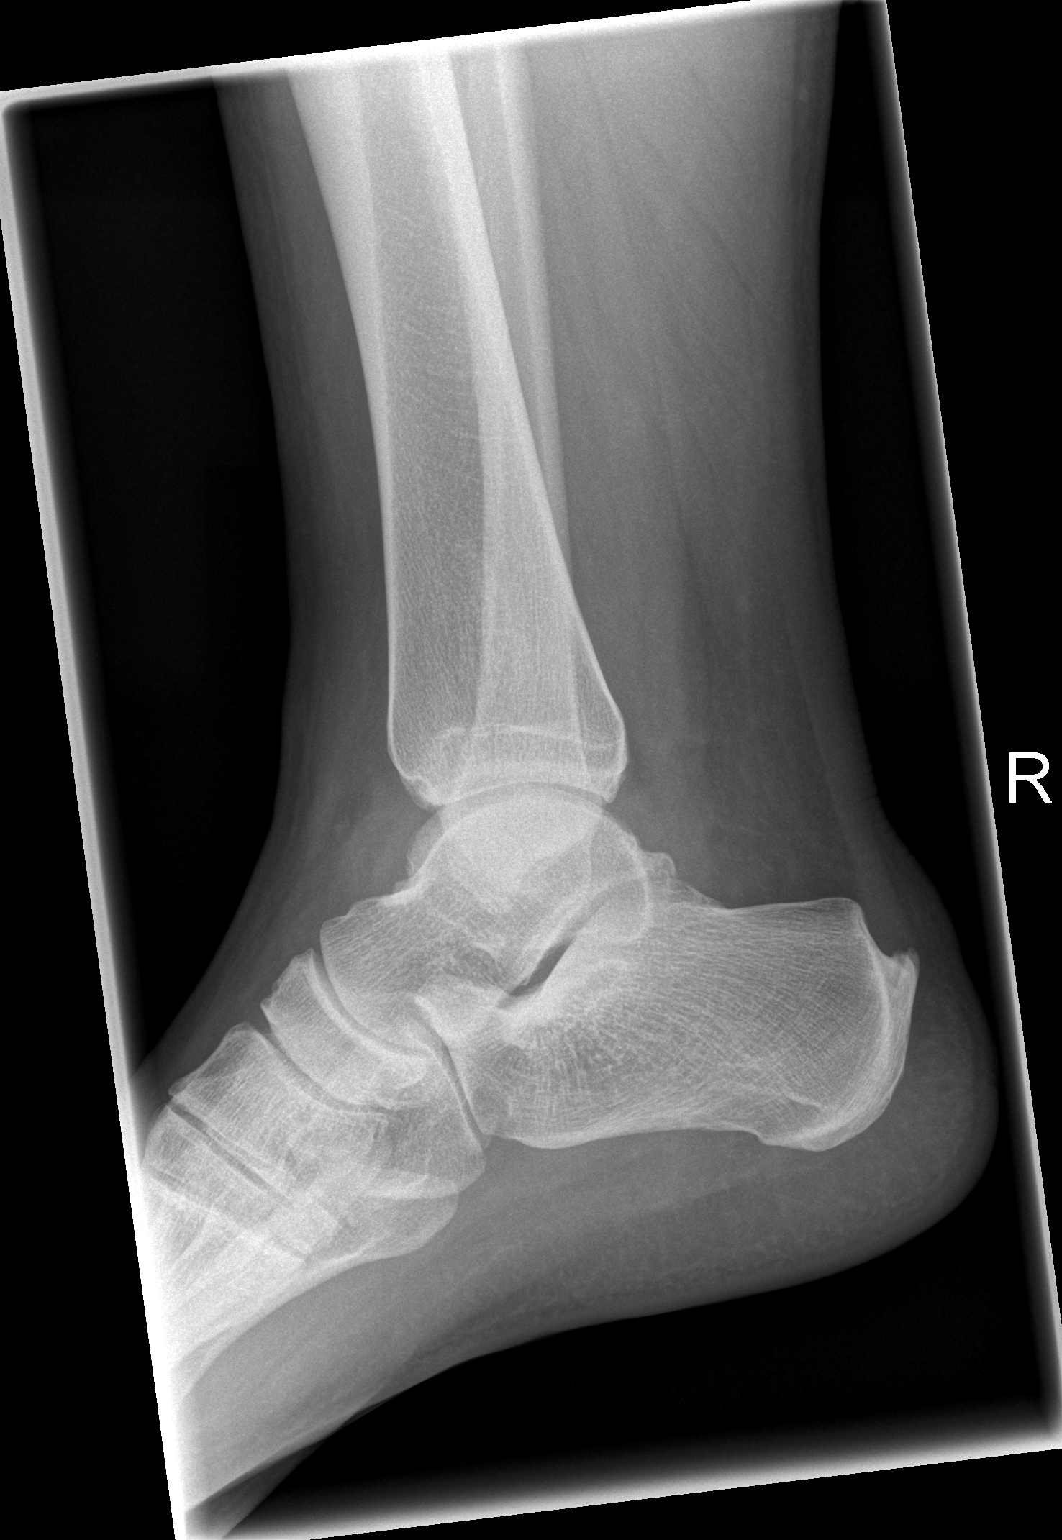

[3 of 3 positions shown; findings below may reference images not displayed]

FINDINGS: There is no evidence of fracture or dislocation. The ankle mortise
is intact; the interosseous space is within normal limits. No talar
tilt or subluxation is seen. A likely os subfibulare is incidentally
noted.

The joint spaces are preserved. No significant soft tissue
abnormalities are seen.
IMPRESSION: No evidence of fracture or dislocation.

## 2015-08-12 ENCOUNTER — Telehealth (HOSPITAL_BASED_OUTPATIENT_CLINIC_OR_DEPARTMENT_OTHER): Payer: Self-pay | Admitting: Emergency Medicine

## 2015-08-12 NOTE — Telephone Encounter (Signed)
Post ED Visit - Positive Culture Follow-up  Culture report reviewed by antimicrobial stewardship pharmacist:   Enzo Bi, Pharm.D.  Celedonio Miyamoto, Pharm.D., BCPS  Garvin Fila, Pharm.D.  Georgina Pillion, [address].D., BCPS  [place], [address].D., BCPS, AAHIVP  Estella Husk, Pharm.D., BCPS, AAHIVP  Tennis Must, Pharm.D.  Sherle Poe, [address].D.  Positive urine culture E.coli Treated with ciprofloxacin, organism sensitive to the same and no further patient follow-up is required at this time.  Berle Mull 08/12/2015, 9:34 AM

## 2017-08-10 IMAGING — CT CT RENAL STONE PROTOCOL
2 of 4 series · 16 of 46 positions shown, 18 images · non-contrast
Comparison: CT 09/15/2011.

CLINICAL DATA: 29-year-old with bilateral flank pain for 4 days and
hematuria. History of renal calculi. Previous appendectomy.

EXAM:
CT ABDOMEN AND PELVIS WITHOUT CONTRAST
TECHNIQUE: Multidetector CT imaging of the abdomen and pelvis was performed
following the standard protocol without IV contrast.

[Series 2: axial st · axial · 0.90mm/px · z∈[-501,-41]mm · 13 of 101 slices shown, 15 images]
[im 5/101  soft-tissue]
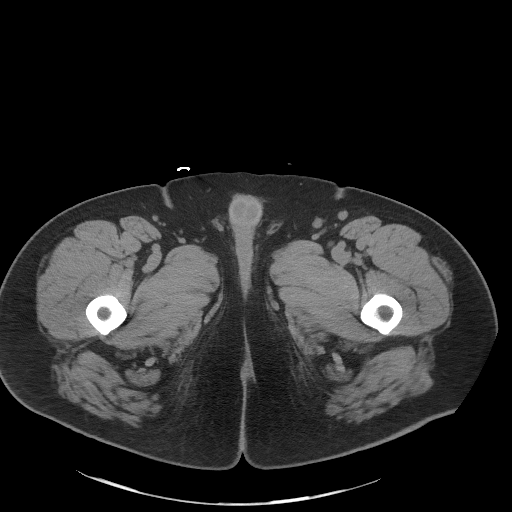
[im 5/101  bone]
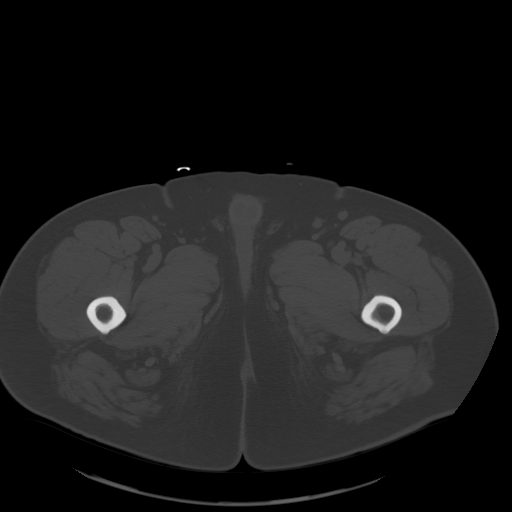
[im 13/101  soft-tissue]
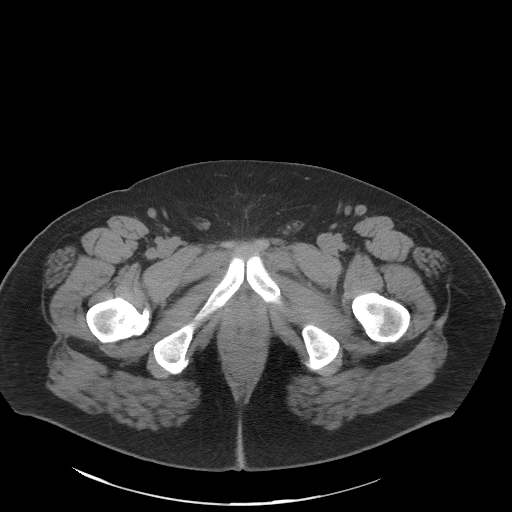
[im 21/101  soft-tissue]
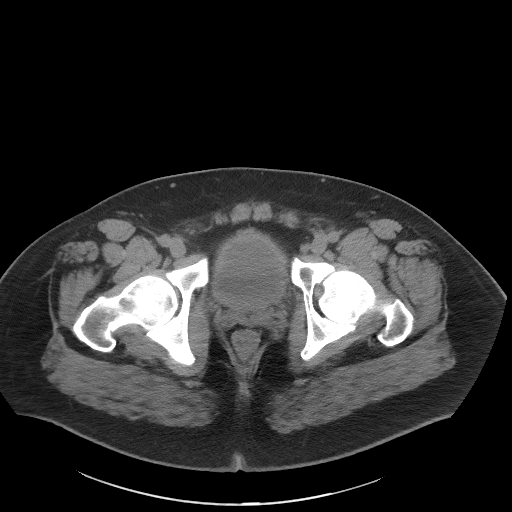
[im 29/101  soft-tissue]
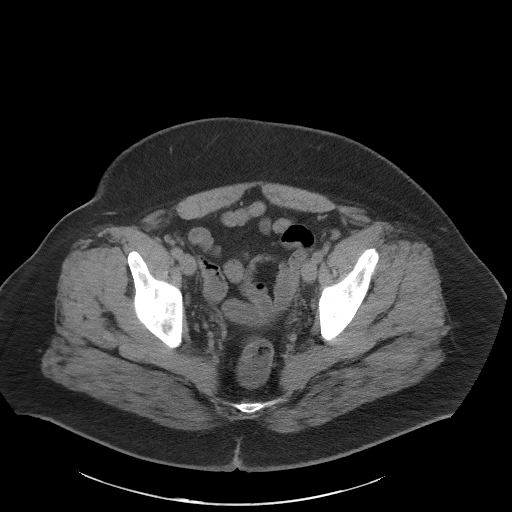
[im 37/101  soft-tissue]
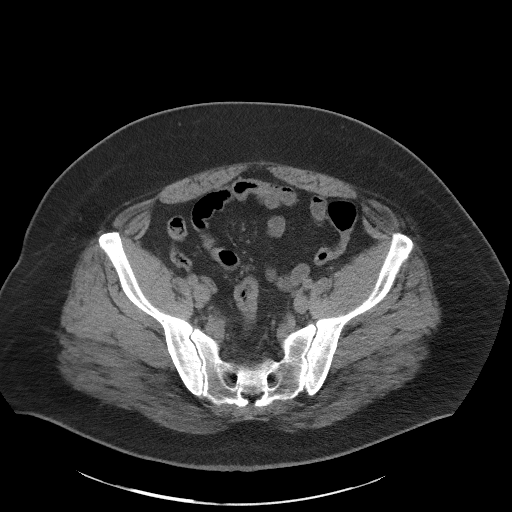
[im 45/101  soft-tissue]
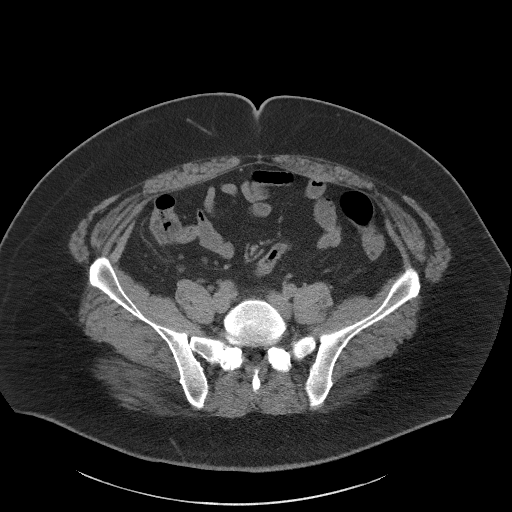
[im 53/101  soft-tissue]
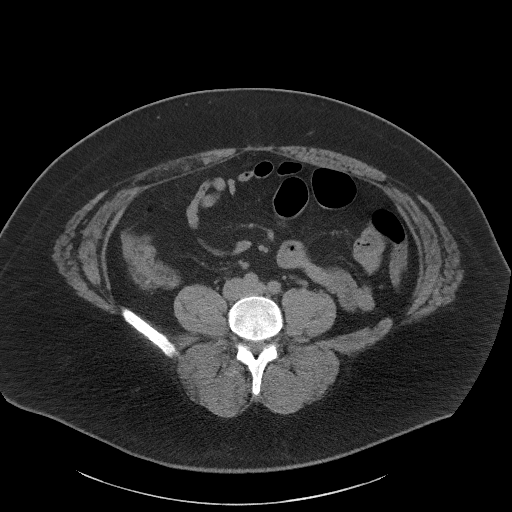
[im 57/101  soft-tissue]
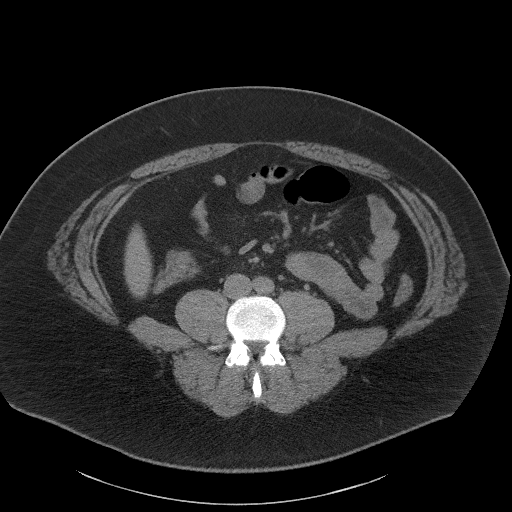
[im 65/101  soft-tissue]
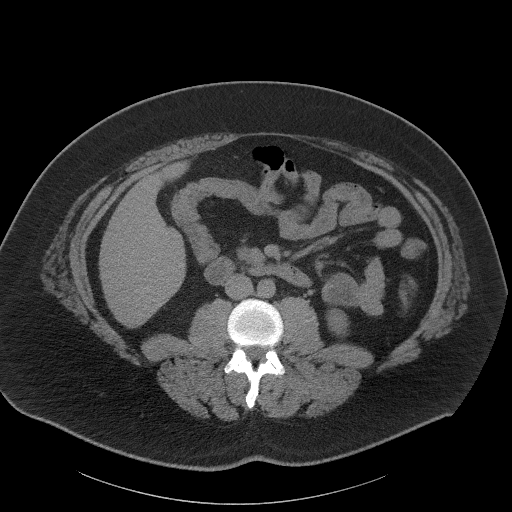
[im 65/101  bone]
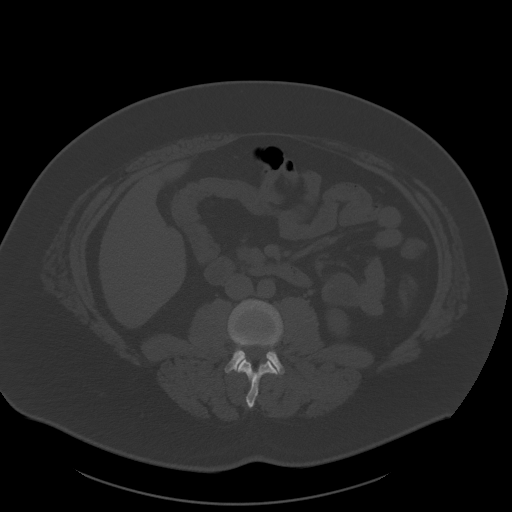
[im 73/101  soft-tissue]
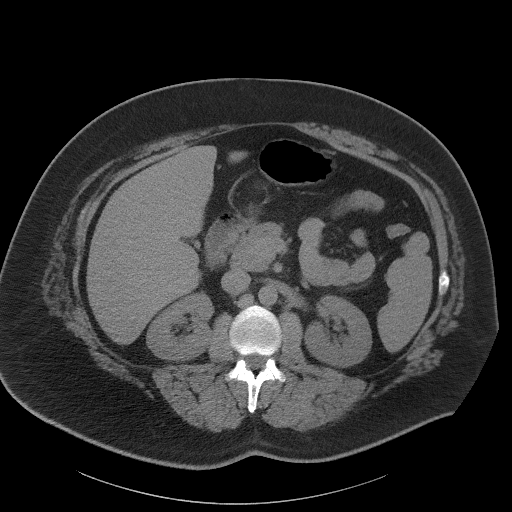
[im 81/101  soft-tissue]
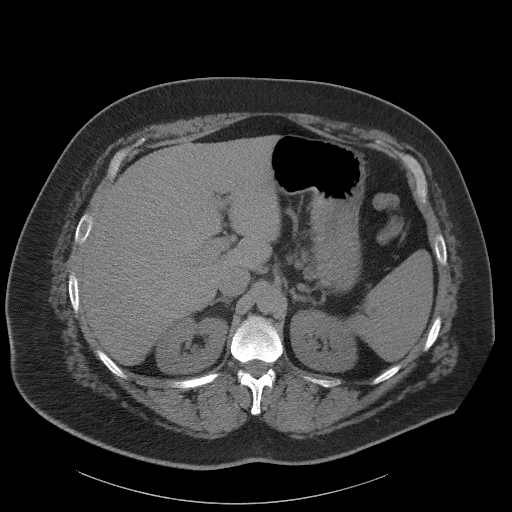
[im 89/101  soft-tissue]
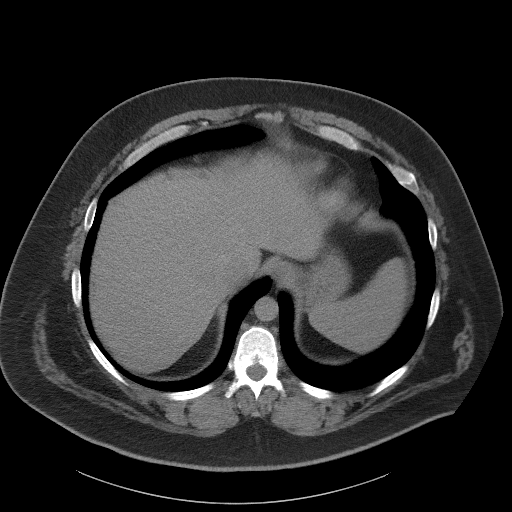
[im 97/101  soft-tissue]
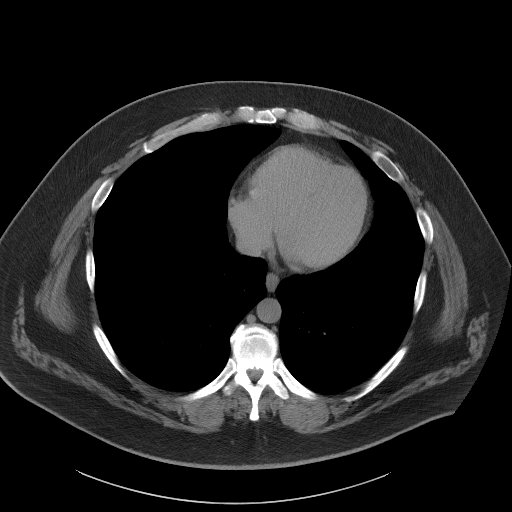

[Series 5: coronal st · coronal · 0.98mm/px · 3 of 106 slices shown]
[im 36/106  soft-tissue]
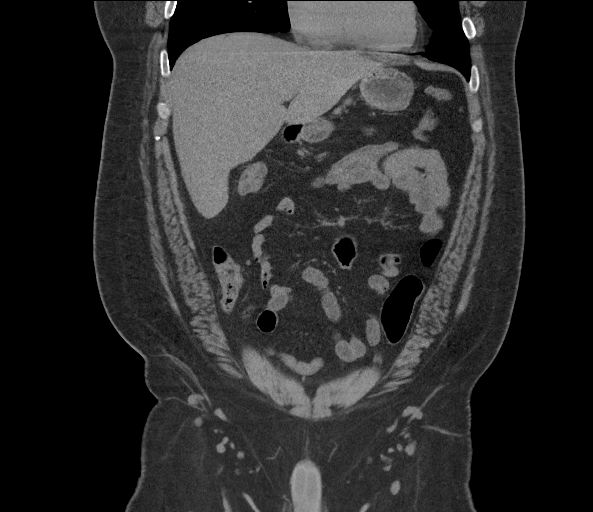
[im 47/106  soft-tissue]
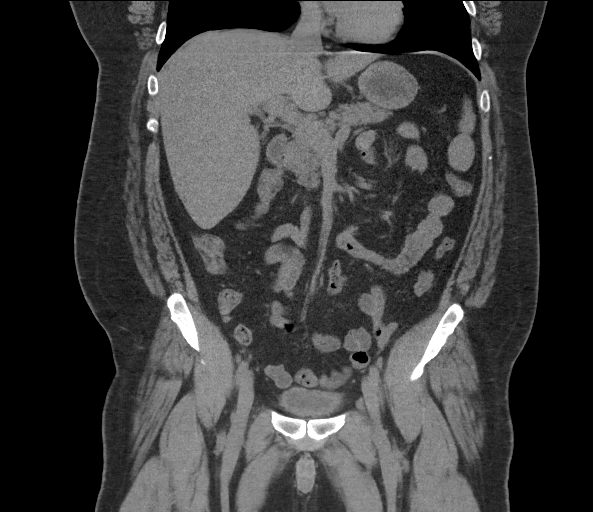
[im 59/106  soft-tissue]
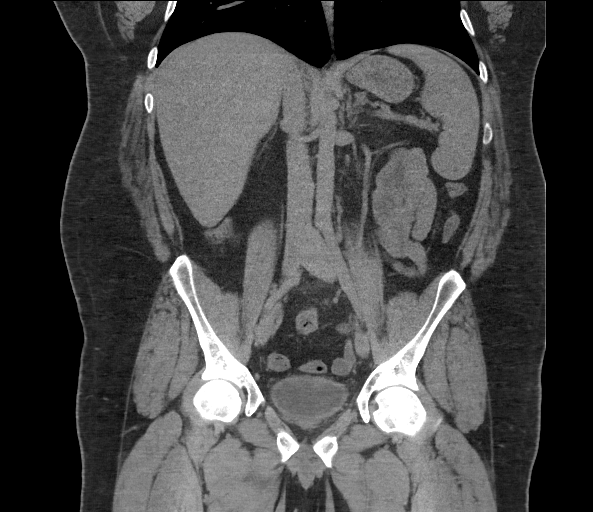

[16 of 46 positions shown; findings below may reference images not displayed]

FINDINGS: Lower chest: Clear lung bases. No significant pleural or pericardial
effusion.

Hepatobiliary: The liver demonstrates decreased density consistent
with steatosis. No focal abnormalities are identified on noncontrast
imaging. No significant biliary dilatation status post
cholecystectomy.

Pancreas: Unremarkable. No pancreatic ductal dilatation or
surrounding inflammatory changes.

Spleen: Normal in size without focal abnormality.

Adrenals/Urinary Tract: Both adrenal glands appear normal. There is
no evidence of urinary tract calculus or hydronephrosis. Both
kidneys appear unremarkable as imaged in the noncontrast state.
There is possible mild bladder wall thickening, although this could
be related to incomplete distention.

Stomach/Bowel: No evidence of bowel wall thickening, distention or
surrounding inflammatory change. The appendix is visualized and
appears normal.

Vascular/Lymphatic: There are no enlarged abdominal or pelvic lymph
nodes. No significant vascular findings on noncontrast imaging.

Reproductive: Unremarkable.

Other: No evidence of abdominal wall mass or hernia.

Musculoskeletal: No acute or significant osseous findings. Mild
degenerative changes are noted at both hips.
IMPRESSION: 1. No evidence of urinary tract calculus or hydronephrosis.
2. Possible bladder wall thickening versus incomplete distention.
Correlation with urine analysis recommended to exclude cystitis.
3. Hepatic steatosis.

## 2018-04-19 DEATH — deceased
